# Patient Record
Sex: Female | Born: 1995 | Race: Black or African American | Hispanic: No | Marital: Single | Smoking: Current every day smoker
Health system: Southern US, Community
[De-identification: ages and names within clinical notes are randomized; demographics above are authoritative.]

---
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
## 2014-08-18 ENCOUNTER — Emergency Department: Payer: Self-pay | Admitting: Emergency Medicine

## 2015-02-25 NOTE — H&P (Signed)
L&D Evaluation:  History:   HPI 19yo G1 at 7726w5d by 13wk U/S presents with c/o persistent LOF since 1100 today.  No contractions or pain. PNC at [facility].  PNL - O+, RPR NR, RI, HepB -, HIV -, GC/chlam -, 1h GTT 105, Hgb 11.4, Varicella equivocal, GBS +    Presents with leaking fluid    Patient's Medical History Asthma  well controlled    Patient's Surgical History none    Medications Pre Natal Vitamins  pulmicort, zyrtec, albuterol    Allergies NKDA    Social History none    Family History Non-Contributory   ROS:   ROS All systems were reviewed.  HEENT, CNS, GI, GU, Respiratory, CV, Renal and Musculoskeletal systems were found to be normal.   Exam:   Vital Signs stable    General no apparent distress    Mental Status clear    Chest nl effort    Abdomen gravid, non-tender    Estimated Fetal Weight Average for gestational age, 7.5#    Edema no edema    Pelvic 4/ 100 / -1, + nitrazine, gross ROM    Mebranes Ruptured    FHT normal rate with no decels    Ucx absent    Skin dry   Impression:   Impression 19yo G1 at 3926w5d with PROM   Plan:   Plan monitor contractions and for cervical change, antibiotics for GBBS prophylaxis, start pitocin for augmentation of labor   Electronic Signatures: Garnette GunnerStansbury Clipp, Ali LoweEryn K (MD)  (Signed 15-Sep-13 16:48)  Authored: L&D Evaluation   Last Updated: 15-Sep-13 16:48 by Garnette GunnerStansbury Clipp, Ali LoweEryn K (MD)

## 2017-09-18 ENCOUNTER — Encounter: Payer: Self-pay | Admitting: Emergency Medicine

## 2017-09-18 ENCOUNTER — Emergency Department: Payer: Managed Care, Other (non HMO)

## 2017-09-18 ENCOUNTER — Emergency Department
Admission: EM | Admit: 2017-09-18 | Discharge: 2017-09-18 | Disposition: A | Discharge status: Home / Self Care | Payer: Managed Care, Other (non HMO) | Attending: Emergency Medicine | Admitting: Emergency Medicine

## 2017-09-18 DIAGNOSIS — N898 Other specified noninflammatory disorders of vagina: Secondary | ICD-10-CM | POA: Diagnosis not present

## 2017-09-18 DIAGNOSIS — N739 Female pelvic inflammatory disease, unspecified: Secondary | ICD-10-CM | POA: Diagnosis not present

## 2017-09-18 DIAGNOSIS — F17200 Nicotine dependence, unspecified, uncomplicated: Secondary | ICD-10-CM | POA: Insufficient documentation

## 2017-09-18 DIAGNOSIS — R1030 Lower abdominal pain, unspecified: Secondary | ICD-10-CM | POA: Diagnosis present

## 2017-09-18 DIAGNOSIS — N730 Acute parametritis and pelvic cellulitis: Secondary | ICD-10-CM

## 2017-09-18 LAB — URINALYSIS, COMPLETE (UACMP) WITH MICROSCOPIC
Bacteria, UA: NONE SEEN
Bilirubin Urine: NEGATIVE
Glucose, UA: NEGATIVE mg/dL
HGB URINE DIPSTICK: NEGATIVE
Ketones, ur: NEGATIVE mg/dL
NITRITE: NEGATIVE
Protein, ur: NEGATIVE mg/dL
SPECIFIC GRAVITY, URINE: 1.016 (ref 1.005–1.030)
pH: 8 (ref 5.0–8.0)

## 2017-09-18 LAB — CBC
HCT: 39.3 % (ref 35.0–47.0)
HEMOGLOBIN: 13.1 g/dL (ref 12.0–16.0)
MCH: 26.4 pg (ref 26.0–34.0)
MCHC: 33.2 g/dL (ref 32.0–36.0)
MCV: 79.5 fL — ABNORMAL LOW (ref 80.0–100.0)
Platelets: 235 10*3/uL (ref 150–440)
RBC: 4.95 MIL/uL (ref 3.80–5.20)
RDW: 13.9 % (ref 11.5–14.5)
WBC: 9 10*3/uL (ref 3.6–11.0)

## 2017-09-18 LAB — LIPASE, BLOOD: Lipase: 23 U/L (ref 11–51)

## 2017-09-18 LAB — COMPREHENSIVE METABOLIC PANEL
ALK PHOS: 80 U/L (ref 38–126)
ALT: 15 U/L (ref 14–54)
ANION GAP: 10 (ref 5–15)
AST: 24 U/L (ref 15–41)
Albumin: 4.4 g/dL (ref 3.5–5.0)
BILIRUBIN TOTAL: 0.2 mg/dL — AB (ref 0.3–1.2)
BUN: 9 mg/dL (ref 6–20)
CALCIUM: 9.6 mg/dL (ref 8.9–10.3)
CO2: 24 mmol/L (ref 22–32)
CREATININE: 0.84 mg/dL (ref 0.44–1.00)
Chloride: 104 mmol/L (ref 101–111)
GFR calc Af Amer: >60 mL/min (ref >60)
GLUCOSE: 92 mg/dL (ref 65–99)
Potassium: 3.7 mmol/L (ref 3.5–5.1)
SODIUM: 138 mmol/L (ref 135–145)
TOTAL PROTEIN: 7.7 g/dL (ref 6.5–8.1)

## 2017-09-18 LAB — CHLAMYDIA/NGC RT PCR (ARMC ONLY)
Chlamydia Tr: NOT DETECTED
N GONORRHOEAE: NOT DETECTED

## 2017-09-18 LAB — HCG, QUANTITATIVE, PREGNANCY: hCG, Beta Chain, Quant, S: <1 m[IU]/mL (ref <5)

## 2017-09-18 LAB — POCT PREGNANCY, URINE: Preg Test, Ur: NEGATIVE

## 2017-09-18 LAB — WET PREP, GENITAL
Clue Cells Wet Prep HPF POC: NONE SEEN
SPERM: NONE SEEN
TRICH WET PREP: NONE SEEN
YEAST WET PREP: NONE SEEN

## 2017-09-18 MED ORDER — CEFTRIAXONE SODIUM 250 MG IJ SOLR
INTRAMUSCULAR | Status: AC
  Filled 2017-09-18: qty 250

## 2017-09-18 MED ORDER — AZITHROMYCIN 500 MG PO TABS
1000.0000 mg | ORAL_TABLET | Freq: Once | ORAL | Status: AC
  Administered 2017-09-18: 1000 mg via ORAL

## 2017-09-18 MED ORDER — IOPAMIDOL (ISOVUE-300) INJECTION 61%
100.0000 mL | Freq: Once | INTRAVENOUS | Status: AC | PRN
  Administered 2017-09-18: 100 mL via INTRAVENOUS
  Filled 2017-09-18: qty 100

## 2017-09-18 MED ORDER — ONDANSETRON 4 MG PO TBDP: 4.0000 mg | ORAL_TABLET | Freq: Three times a day (TID) | ORAL | 0 refills | Status: DC | PRN

## 2017-09-18 MED ORDER — HYDROCODONE-ACETAMINOPHEN 5-325 MG PO TABS: 1.0000 | ORAL_TABLET | Freq: Four times a day (QID) | ORAL | 0 refills | Status: DC | PRN

## 2017-09-18 MED ORDER — CEFTRIAXONE SODIUM 250 MG IJ SOLR
250.0000 mg | Freq: Once | INTRAMUSCULAR | Status: AC
  Administered 2017-09-18: 250 mg via INTRAMUSCULAR

## 2017-09-18 MED ORDER — AZITHROMYCIN 500 MG PO TABS
ORAL_TABLET | ORAL | Status: AC
  Filled 2017-09-18: qty 2

## 2017-09-18 NOTE — ED Triage Notes (Signed)
Patient with complaint of lower abdominal pain with white vaginal discharge and pain with urination that started yesterday.

## 2017-09-18 NOTE — Discharge Instructions (Signed)
Please take your pain and nausea medication as needed for severe symptoms and make sure you follow-up with your doctor in 2 days for recheck.  If you are unable to get in to see your primary care physician please come to either urgent care or back to our emergency department.  Return to the emergency department sooner for any new or worsening symptoms such as worsening pain, fevers, chills, if you cannot eat or drink, or for any other issues whatsoever.  It was a pleasure to take care of you today, and thank you for coming to our emergency department.  If you have any questions or concerns before leaving please ask the nurse to grab me and I'm more than happy to go through your aftercare instructions again.  If you were prescribed any opioid pain medication today such as Norco, Vicodin, Percocet, morphine, hydrocodone, or oxycodone please make sure you do not drive when you are taking this medication as it can alter your ability to drive safely.  If you have any concerns once you are home that you are not improving or are in fact getting worse before you can make it to your follow-up appointment, please do not hesitate to call 911 and come back for further evaluation.  Merrily BrittleNeil Danely Bayliss, MD  Results for orders placed or performed during the hospital encounter of 09/18/17  Wet prep, genital  Result Value Ref Range   Yeast Wet Prep HPF POC NONE SEEN NONE SEEN   Trich, Wet Prep NONE SEEN NONE SEEN   Clue Cells Wet Prep HPF POC NONE SEEN NONE SEEN   WBC, Wet Prep HPF POC FEW (A) NONE SEEN   Sperm NONE SEEN   Lipase, blood  Result Value Ref Range   Lipase 23 11 - 51 U/L  Comprehensive metabolic panel  Result Value Ref Range   Sodium 138 135 - 145 mmol/L   Potassium 3.7 3.5 - 5.1 mmol/L   Chloride 104 101 - 111 mmol/L   CO2 24 22 - 32 mmol/L   Glucose, Bld 92 65 - 99 mg/dL   BUN 9 6 - 20 mg/dL   Creatinine, Ser 0.980.84 0.44 - 1.00 mg/dL   Calcium 9.6 8.9 - 11.910.3 mg/dL   Total Protein 7.7 6.5 -  8.1 g/dL   Albumin 4.4 3.5 - 5.0 g/dL   AST 24 15 - 41 U/L   ALT 15 14 - 54 U/L   Alkaline Phosphatase 80 38 - 126 U/L   Total Bilirubin 0.2 (L) 0.3 - 1.2 mg/dL   GFR calc non Af Amer >60 >60 mL/min   GFR calc Af Amer >60 >60 mL/min   Anion gap 10 5 - 15  CBC  Result Value Ref Range   WBC 9.0 3.6 - 11.0 K/uL   RBC 4.95 3.80 - 5.20 MIL/uL   Hemoglobin 13.1 12.0 - 16.0 g/dL   HCT 14.739.3 82.935.0 - 56.247.0 %   MCV 79.5 (L) 80.0 - 100.0 fL   MCH 26.4 26.0 - 34.0 pg   MCHC 33.2 32.0 - 36.0 g/dL   RDW 13.013.9 86.511.5 - 78.414.5 %   Platelets 235 150 - 440 K/uL  Urinalysis, Complete w Microscopic  Result Value Ref Range   Color, Urine YELLOW (A) YELLOW   APPearance HAZY (A) CLEAR   Specific Gravity, Urine 1.016 1.005 - 1.030   pH 8.0 5.0 - 8.0   Glucose, UA NEGATIVE NEGATIVE mg/dL   Hgb urine dipstick NEGATIVE NEGATIVE   Bilirubin Urine NEGATIVE NEGATIVE  Ketones, ur NEGATIVE NEGATIVE mg/dL   Protein, ur NEGATIVE NEGATIVE mg/dL   Nitrite NEGATIVE NEGATIVE   Leukocytes, UA TRACE (A) NEGATIVE   RBC / HPF 0-5 0 - 5 RBC/hpf   WBC, UA 0-5 0 - 5 WBC/hpf   Bacteria, UA NONE SEEN NONE SEEN   Squamous Epithelial / LPF 6-30 (A) NONE SEEN  hCG, quantitative, pregnancy  Result Value Ref Range   hCG, Beta Chain, Quant, S <1 <5 mIU/mL  Pregnancy, urine POC  Result Value Ref Range   Preg Test, Ur NEGATIVE NEGATIVE   Ct Abdomen Pelvis W Contrast  Result Date: 09/18/2017 CLINICAL DATA:  Lower abdominal pain, vaginal discharge and pain with urination. EXAM: CT ABDOMEN AND PELVIS WITH CONTRAST TECHNIQUE: Multidetector CT imaging of the abdomen and pelvis was performed using the standard protocol following bolus administration of intravenous contrast. CONTRAST:  100mL ISOVUE-300 IOPAMIDOL (ISOVUE-300) INJECTION 61% COMPARISON:  None. FINDINGS: Lower chest: No acute abnormality. Hepatobiliary: No focal liver abnormality is seen. No gallstones, gallbladder wall thickening, or biliary dilatation. Pancreas:  Unremarkable. No pancreatic ductal dilatation or surrounding inflammatory changes. Spleen: Normal in size without focal abnormality. Adrenals/Urinary Tract: Adrenal glands are unremarkable. Kidneys are normal, without renal calculi, focal lesion, or hydronephrosis. Diffuse mucosal thickening of the urinary bladder wall. Stomach/Bowel: Stomach is within normal limits. Appendix appears normal. No evidence of bowel wall thickening, distention, or inflammatory changes. Vascular/Lymphatic: No significant vascular findings are present. No enlarged abdominal or pelvic lymph nodes. Reproductive: Normal appearance of the uterus. Small amount of fluid in the bilateral adnexal regions. 4.5 cm complicated cyst on the right ovary. Suggestion of fluid density curvilinear structure in the right adnexa. Other: Small amount of free pelvic fluid. Musculoskeletal: No acute or significant osseous findings. IMPRESSION: No evidence of appendicitis. 4.5 cm complex cystic structure on the right ovary. Suggestion of fluid density curvilinear structure in the right adnexa. Small amount of fluid in the adnexal regions and in the cul-de-sac of the pelvis. These findings are not conclusive but are suspicious for pelvic inflammatory disease with involvement of the right fallopian tube. Clinical correlation is recommended. If further imaging is desired, pelvic ultrasound may be considered. Diffuse thickening of the urinary bladder wall, likely infectious/ inflammatory. Electronically Signed   By: Ted Mcalpineobrinka  Dimitrova M.D.   On: 09/18/2017 22:12

## 2017-09-18 NOTE — ED Provider Notes (Signed)
[facility] Emergency Department Provider Note  ____________________________________________   First MD Initiated Contact with Patient 09/18/17 2027     (approximate)  I have reviewed the triage vital signs and the nursing notes.   HISTORY  Chief Complaint Abdominal Pain   HPI Sonya Sparks is a 21 y.o. female comes to the emergency department with roughly 24 hours of moderate severity cramping lower abdominal pain.  Also associated with increased amount of white vaginal discharge.  She has had some dysuria and frequency but no back pain or fevers or chills.  She has no history of abdominal surgeries.  She has had no diarrhea.  Her pain is been constant nothing seems to make it better or worse.  It is nonradiating.  History reviewed. No pertinent past medical history.  There are no active problems to display for this patient.   History reviewed. No pertinent surgical history.  Prior to Admission medications   Medication Sig Start Date End Date Taking? Authorizing Provider  HYDROcodone-acetaminophen (NORCO) 5-325 MG tablet Take 1 tablet by mouth every 6 (six) hours as needed for up to 7 doses for severe pain. 09/18/17   Merrily Brittleifenbark, Russel Morain, MD  ondansetron (ZOFRAN ODT) 4 MG disintegrating tablet Take 1 tablet (4 mg total) by mouth every 8 (eight) hours as needed for nausea or vomiting. 09/18/17   Merrily Brittleifenbark, Oktober Glazer, MD    Allergies Patient has no known allergies.  No family history on file.  Social History Social History   Tobacco Use  . Smoking status: Current Every Day Smoker  . Smokeless tobacco: Never Used  Substance Use Topics  . Alcohol use: Not on file  . Drug use: Not on file    Review of Systems Constitutional: No fever/chills Eyes: No visual changes. ENT: No sore throat. Cardiovascular: Denies chest pain. Respiratory: Denies shortness of breath. Gastrointestinal: Positive for abdominal pain.  No nausea, no vomiting.  No diarrhea.  No  constipation. Genitourinary: Positive for dysuria. Musculoskeletal: Negative for back pain. Skin: Negative for rash. Neurological: Negative for headaches, focal weakness or numbness.   ____________________________________________   PHYSICAL EXAM:  VITAL SIGNS: ED Triage Vitals  Enc Vitals Group     BP 09/18/17 2016 (!) 116/56     Pulse Rate 09/18/17 2016 86     Resp 09/18/17 2016 16     Temp 09/18/17 2016 98 F (36.7 C)     Temp Source 09/18/17 2016 Oral     SpO2 09/18/17 2016 100 %     Weight 09/18/17 2017 200 lb (90.7 kg)     Height 09/18/17 2017 5\' 5"  (1.651 m)     Head Circumference --      Peak Flow --      Pain Score 09/18/17 2019 8     Pain Loc --      Pain Edu? --      Excl. in GC? --     Constitutional: Alert and oriented x4 well-appearing nontoxic no diaphoresis speaks in full clear sentences Eyes: PERRL EOMI. Head: Atraumatic. Nose: No congestion/rhinnorhea. Mouth/Throat: No trismus Neck: No stridor.   Cardiovascular: Normal rate, regular rhythm. Grossly normal heart sounds.  Good peripheral circulation. Respiratory: Normal respiratory effort.  No retractions. Lungs CTAB and moving good air Gastrointestinal: Soft mild left greater than right lower quadrant tenderness with negative Rovsing's no McBurney's tenderness and no frank peritonitis Pelvic exam chaperoned by female nurse Penni BombardKendall: Normal external exam.  Moderate amount of thick white discharge in the vault.  No cervical motion tenderness no adnexal tenderness Musculoskeletal: No lower extremity edema   Neurologic:  Normal speech and language. No gross focal neurologic deficits are appreciated. Skin:  Skin is warm, dry and intact. No rash noted. Psychiatric: Mood and affect are normal. Speech and behavior are normal.    ____________________________________________   DIFFERENTIAL includes but not limited to  Appendicitis, urinary tract infection, diverticulitis, pelvic inflammatory disease,  tubo-ovarian abscess ____________________________________________   LABS (all labs ordered are listed, but only abnormal results are displayed)  Labs Reviewed  WET PREP, GENITAL - Abnormal; Notable for the following components:      Result Value   WBC, Wet Prep HPF POC FEW (*)    All other components within normal limits  COMPREHENSIVE METABOLIC PANEL - Abnormal; Notable for the following components:   Total Bilirubin 0.2 (*)    All other components within normal limits  CBC - Abnormal; Notable for the following components:   MCV 79.5 (*)    All other components within normal limits  URINALYSIS, COMPLETE (UACMP) WITH MICROSCOPIC - Abnormal; Notable for the following components:   Color, Urine YELLOW (*)    APPearance HAZY (*)    Leukocytes, UA TRACE (*)    Squamous Epithelial / LPF 6-30 (*)    All other components within normal limits  CHLAMYDIA/NGC RT PCR ([facility] ONLY)  LIPASE, BLOOD  HCG, QUANTITATIVE, PREGNANCY  POC URINE PREG, ED  POCT PREGNANCY, URINE    Lab work reviewed by me is nonspecific with no acute disease.  Some white cells on the wet prep could represent STI __________________________________________  EKG   ____________________________________________  RADIOLOGY  CT abdomen pelvis reviewed by me negative for appendicitis but is concerning for PID ____________________________________________   PROCEDURES  Procedure(s) performed: no  Procedures  Critical Care performed: no  Observation: no ____________________________________________   INITIAL IMPRESSION / ASSESSMENT AND PLAN / ED COURSE  Pertinent labs & imaging results that were available during my care of the patient were reviewed by me and considered in my medical decision making (see chart for details).  The patient arrives uncomfortable appearing with significant right lower quadrant tenderness.  Pain medication and CT scan are  pending.  ----------------------------------------- 10:20 PM on 09/18/2017 -----------------------------------------  Fortunately the patient's pain is improved.  While her GC CT is pending the CT scan is concerning for pelvic inflammatory disease so I recommended presumptive treatment now and the patient agrees.  No evidence of tubo-ovarian abscess.  We will discharge her home with Norco and Zofran and a 48-hour abdominal recheck.  She verbalized understanding and agreement with the plan.     ____________________________________________   FINAL CLINICAL IMPRESSION(S) / ED DIAGNOSES  Final diagnoses:  Pelvic inflammatory disease, acute      NEW MEDICATIONS STARTED DURING THIS VISIT:  This SmartLink is deprecated. Use AVSMEDLIST instead to display the medication list for a patient.   Note:  This document was prepared using Dragon voice recognition software and may include unintentional dictation errors.     Merrily Brittleifenbark, Kaliyan Osbourn, MD 09/18/17 2221

## 2018-08-08 IMAGING — CT CT ABD-PELV W/ CM
2 of 4 series · 16 of 46 positions shown, 18 images · IV contrast (APPLIED)
Comparison: None.

CLINICAL DATA: Lower abdominal pain, vaginal discharge and pain
with urination.

EXAM:
CT ABDOMEN AND PELVIS WITH CONTRAST
TECHNIQUE: Multidetector CT imaging of the abdomen and pelvis was performed
using the standard protocol following bolus administration of
intravenous contrast.
CONTRAST:  100mL UM0AAT-OHH IOPAMIDOL (UM0AAT-OHH) INJECTION 61%

[Series 2: routine abd/pel with · axial · 0.68mm/px · z∈[-455,-65]mm · 13 of 86 slices shown, 15 images]
[im 4/86  soft-tissue]
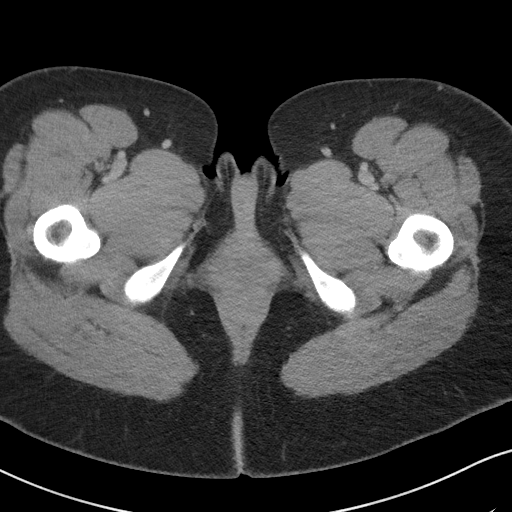
[im 4/86  bone]
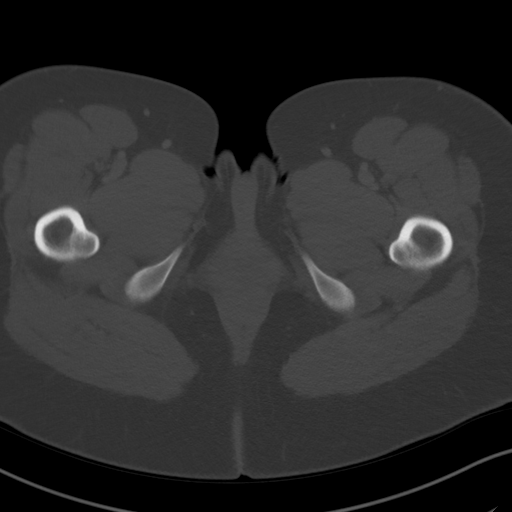
[im 11/86  soft-tissue]
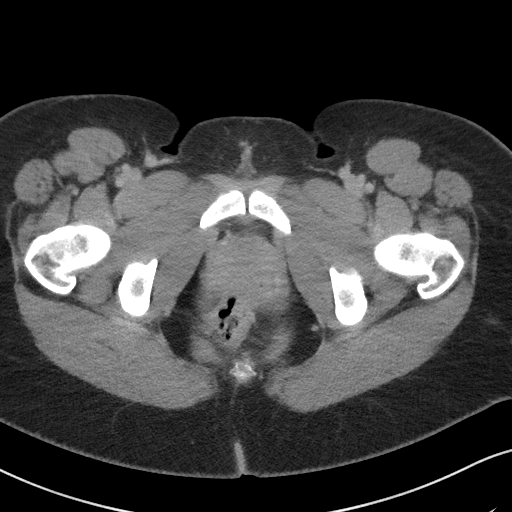
[im 18/86  soft-tissue]
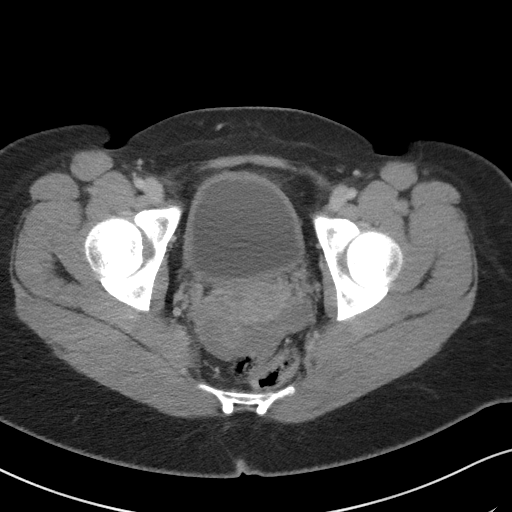
[im 25/86  soft-tissue]
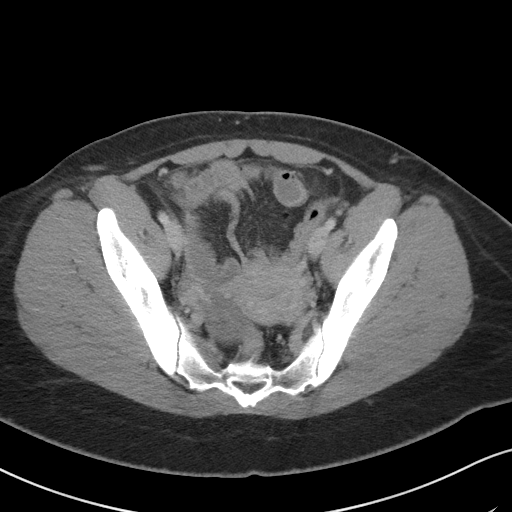
[im 29/86  soft-tissue]
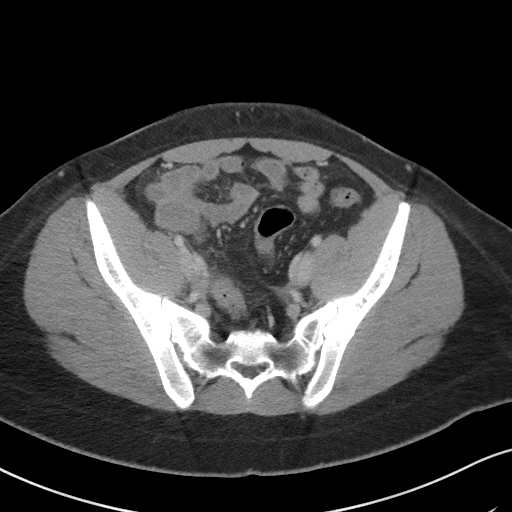
[im 36/86  soft-tissue]
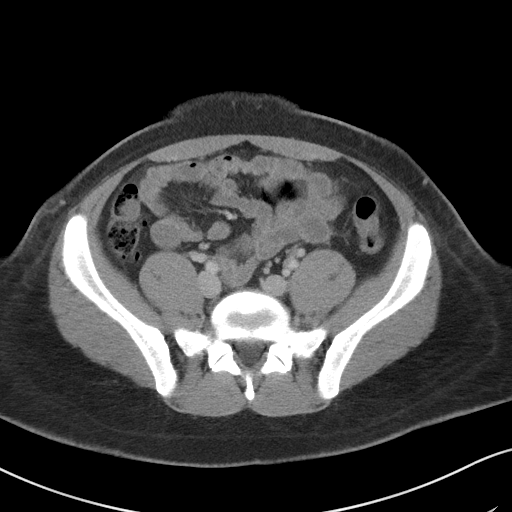
[im 43/86  soft-tissue]
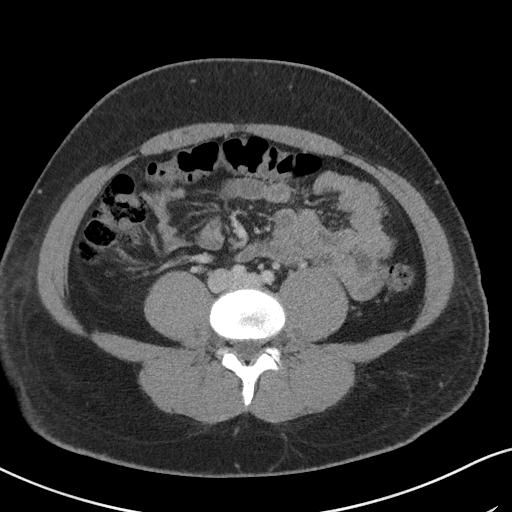
[im 50/86  soft-tissue]
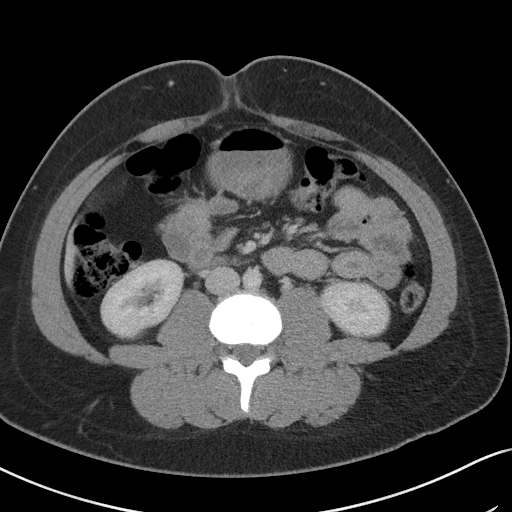
[im 57/86  soft-tissue]
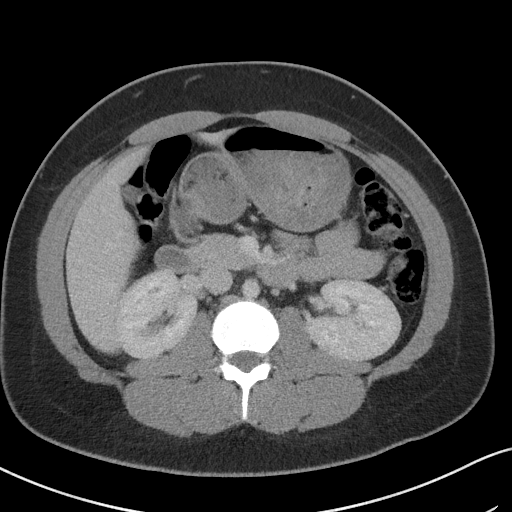
[im 57/86  bone]
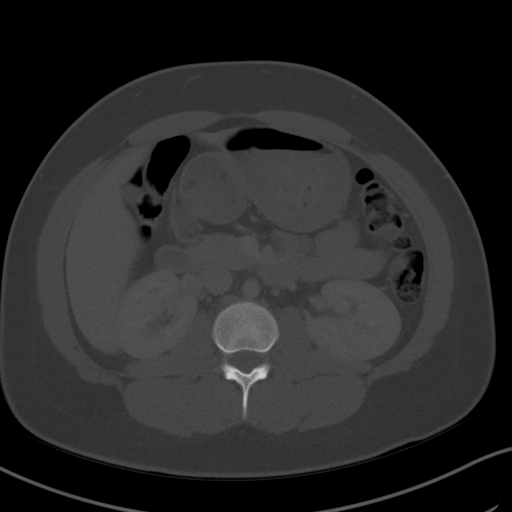
[im 61/86  soft-tissue]
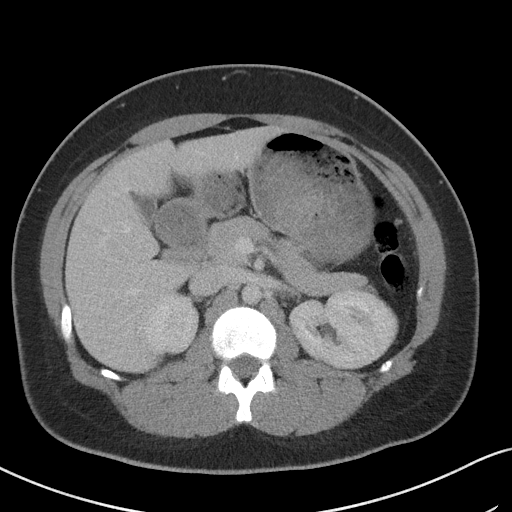
[im 68/86  soft-tissue]
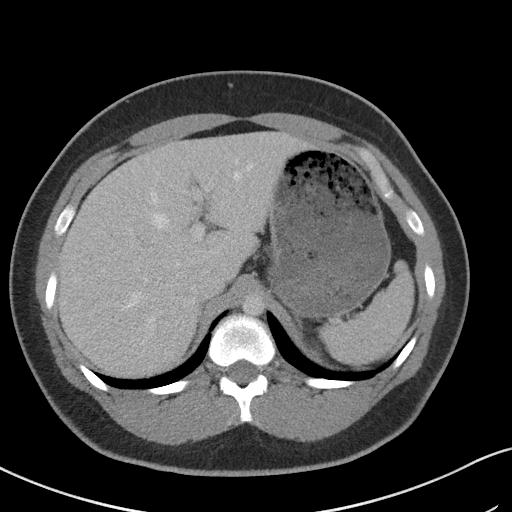
[im 75/86  soft-tissue]
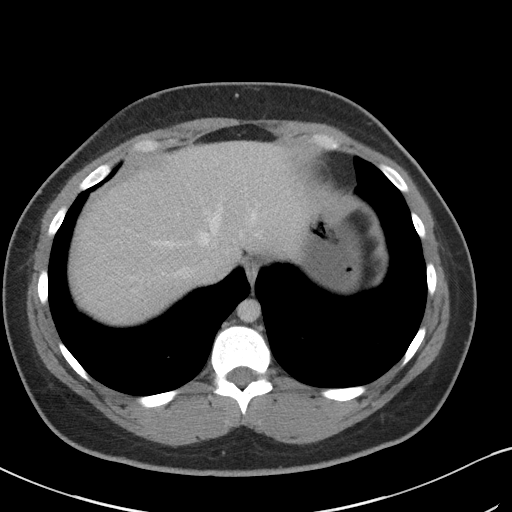
[im 82/86  soft-tissue]
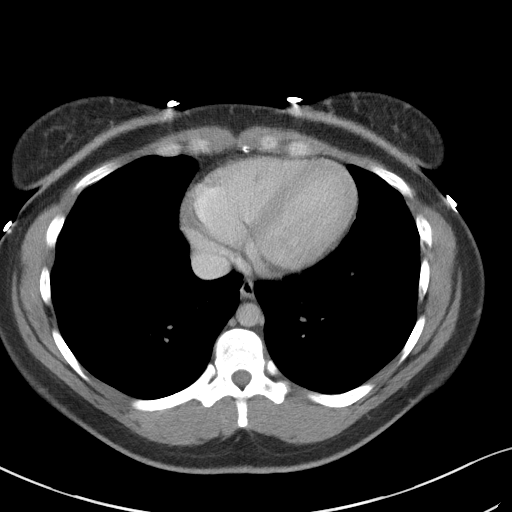

[Series 5: coronal st · coronal · 0.70mm/px · 3 of 92 slices shown]
[im 31/92  soft-tissue]
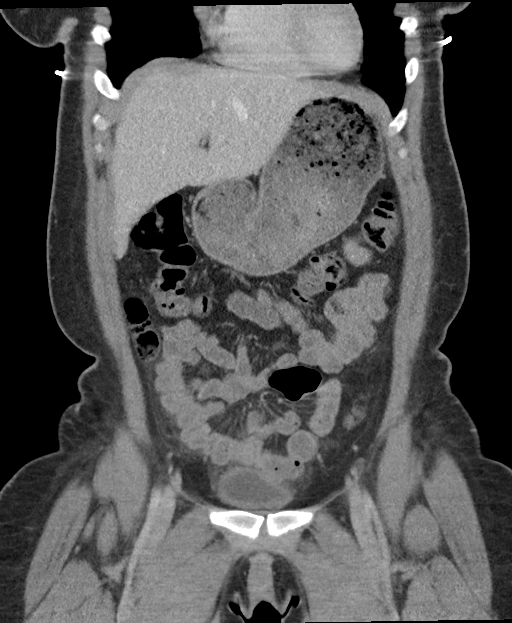
[im 41/92  soft-tissue]
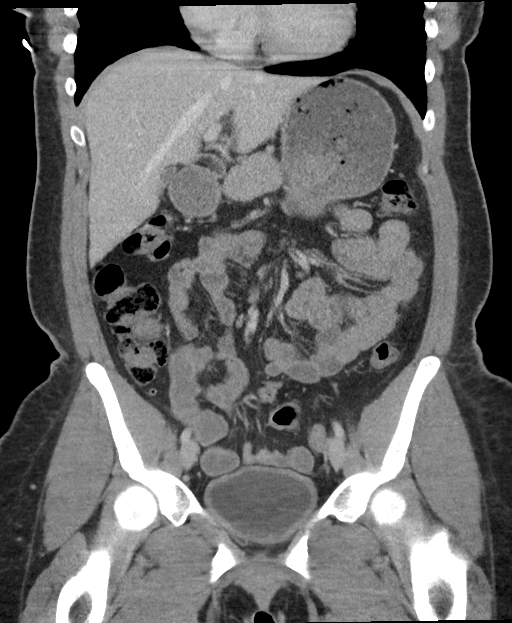
[im 51/92  soft-tissue]
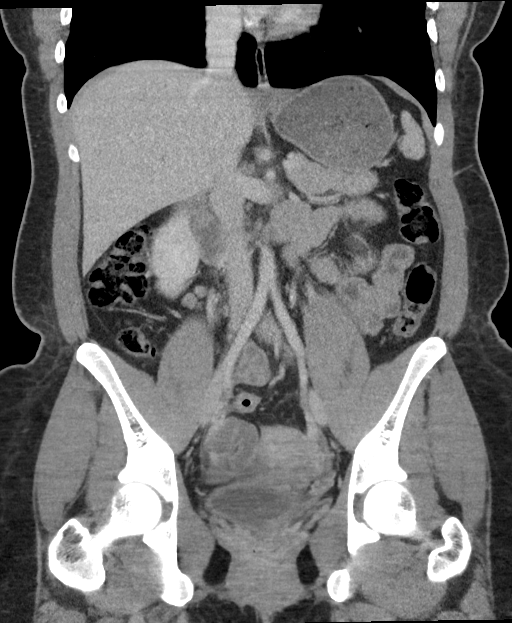

[16 of 46 positions shown; findings below may reference images not displayed]

FINDINGS: Lower chest: No acute abnormality.

Hepatobiliary: No focal liver abnormality is seen. No gallstones,
gallbladder wall thickening, or biliary dilatation.

Pancreas: Unremarkable. No pancreatic ductal dilatation or
surrounding inflammatory changes.

Spleen: Normal in size without focal abnormality.

Adrenals/Urinary Tract: Adrenal glands are unremarkable. Kidneys are
normal, without renal calculi, focal lesion, or hydronephrosis.
Diffuse mucosal thickening of the urinary bladder wall.

Stomach/Bowel: Stomach is within normal limits. Appendix appears
normal. No evidence of bowel wall thickening, distention, or
inflammatory changes.

Vascular/Lymphatic: No significant vascular findings are present. No
enlarged abdominal or pelvic lymph nodes.

Reproductive: Normal appearance of the uterus. Small amount of fluid
in the bilateral adnexal regions. 4.5 cm complicated cyst on the
right ovary. Suggestion of fluid density curvilinear structure in
the right adnexa.

Other: Small amount of free pelvic fluid.

Musculoskeletal: No acute or significant osseous findings.
IMPRESSION: No evidence of appendicitis.

4.5 cm complex cystic structure on the right ovary. Suggestion of
fluid density curvilinear structure in the right adnexa. Small
amount of fluid in the adnexal regions and in the cul-de-sac of the
pelvis. These findings are not conclusive but are suspicious for
pelvic inflammatory disease with involvement of the right fallopian
tube. Clinical correlation is recommended. If further imaging is
desired, pelvic ultrasound may be considered.

Diffuse thickening of the urinary bladder wall, likely infectious/
inflammatory.

## 2019-06-01 ENCOUNTER — Emergency Department
Admission: EM | Admit: 2019-06-01 | Discharge: 2019-06-01 | Disposition: A | Discharge status: Home / Self Care | Payer: Medicaid Other | Attending: Emergency Medicine | Admitting: Emergency Medicine

## 2019-06-01 ENCOUNTER — Emergency Department: Payer: Medicaid Other

## 2019-06-01 ENCOUNTER — Other Ambulatory Visit: Payer: Self-pay

## 2019-06-01 DIAGNOSIS — Z79899 Other long term (current) drug therapy: Secondary | ICD-10-CM | POA: Insufficient documentation

## 2019-06-01 DIAGNOSIS — F17200 Nicotine dependence, unspecified, uncomplicated: Secondary | ICD-10-CM | POA: Diagnosis not present

## 2019-06-01 DIAGNOSIS — M79604 Pain in right leg: Secondary | ICD-10-CM

## 2019-06-01 LAB — BASIC METABOLIC PANEL
Anion gap: 8 (ref 5–15)
BUN: 9 mg/dL (ref 6–20)
CO2: 23 mmol/L (ref 22–32)
Calcium: 9.2 mg/dL (ref 8.9–10.3)
Chloride: 108 mmol/L (ref 98–111)
Creatinine, Ser: 0.83 mg/dL (ref 0.44–1.00)
GFR calc Af Amer: >60 mL/min (ref >60)
GFR calc non Af Amer: >60 mL/min (ref >60)
Glucose, Bld: 80 mg/dL (ref 70–99)
Potassium: 4.2 mmol/L (ref 3.5–5.1)
Sodium: 139 mmol/L (ref 135–145)

## 2019-06-01 LAB — CBC WITH DIFFERENTIAL/PLATELET
Abs Immature Granulocytes: 0.01 10*3/uL (ref 0.00–0.07)
Basophils Absolute: 0 10*3/uL (ref 0.0–0.1)
Basophils Relative: 1 %
Eosinophils Absolute: 0.2 10*3/uL (ref 0.0–0.5)
Eosinophils Relative: 3 %
HCT: 40.2 % (ref 36.0–46.0)
Hemoglobin: 12.5 g/dL (ref 12.0–15.0)
Immature Granulocytes: 0 %
Lymphocytes Relative: 39 %
Lymphs Abs: 2.6 10*3/uL (ref 0.7–4.0)
MCH: 25.2 pg — ABNORMAL LOW (ref 26.0–34.0)
MCHC: 31.1 g/dL (ref 30.0–36.0)
MCV: 81 fL (ref 80.0–100.0)
Monocytes Absolute: 0.4 10*3/uL (ref 0.1–1.0)
Monocytes Relative: 6 %
Neutro Abs: 3.4 10*3/uL (ref 1.7–7.7)
Neutrophils Relative %: 51 %
Platelets: 240 10*3/uL (ref 150–400)
RBC: 4.96 MIL/uL (ref 3.87–5.11)
RDW: 13.7 % (ref 11.5–15.5)
WBC: 6.6 10*3/uL (ref 4.0–10.5)
nRBC: 0 % (ref 0.0–0.2)

## 2019-06-01 MED ORDER — NAPROXEN 500 MG PO TABS: 500.0000 mg | ORAL_TABLET | Freq: Two times a day (BID) | ORAL | 1 refills | Status: DC

## 2019-06-01 NOTE — Discharge Instructions (Addendum)
Follow-up with your doctor at [facility] if any continued problems.  Begin taking naproxen 500 mg twice daily with food.  You may use ice or heat to your leg as needed for discomfort.  You may take Tylenol with this medication if additional pain medication is needed.

## 2019-06-01 NOTE — ED Provider Notes (Signed)
[facility] Emergency Department Provider Note   ____________________________________________   First MD Initiated Contact with Patient 06/01/19 1007     (approximate)  I have reviewed the triage vital signs and the nursing notes.   HISTORY  Chief Complaint Fatigue and Leg Pain   HPI Sonya Sparks is a 23 y.o. female presents to the ED with complaint of right upper leg pain for over 1 month.  Patient denies any injury and does not take any over-the-counter medication.  Patient states that she had a virtual visit with her PCP today and was told to come to the emergency department for a ultrasound to rule out DVT.  Patient currently has a Nexplanon for the last 7 months without any difficulties.  She also continues to smoke less than 1/2 pack cigarettes per day.  She denies any recent traveling, injury, shortness of breath or previous history of DVT.  She continues to ambulate without any assistance.  Currently she rates her pain as a 0/10.      History reviewed. No pertinent past medical history.  There are no active problems to display for this patient.   History reviewed. No pertinent surgical history.  Prior to Admission medications   Medication Sig Start Date End Date Taking? Authorizing Provider  albuterol (VENTOLIN HFA) 108 (90 Base) MCG/ACT inhaler Inhale 1-2 puffs into the lungs every 6 (six) hours as needed for wheezing or shortness of breath.   Yes [provider]  etonogestrel (NEXPLANON) 68 MG IMPL implant 1 each by Subdermal route once.   Yes [provider]  naproxen (NAPROSYN) 500 MG tablet Take 1 tablet (500 mg total) by mouth 2 (two) times daily with a meal. 06/01/19   Tommi RumpsSummers, Eufemia Prindle L, PA-C    Allergies Patient has no known allergies.  No family history on file.  Social History Social History   Tobacco Use   Smoking status: Current Every Day Smoker   Smokeless tobacco: Never Used  Substance Use Topics    Alcohol use: Not on file   Drug use: Not on file    Review of Systems Constitutional: No fever/chills Cardiovascular: Denies chest pain. Respiratory: Denies shortness of breath. Genitourinary: Negative for dysuria. Musculoskeletal: Positive for right leg pain. Skin: Negative for rash. Neurological: Negative for headaches, focal weakness or numbness. ___________________________________________   PHYSICAL EXAM:  VITAL SIGNS: ED Triage Vitals [06/01/19 0954]  Enc Vitals Group     BP 131/62     Pulse Rate 78     Resp 18     Temp 99.8 F (37.7 C)     Temp Source Oral     SpO2 100 %     Weight 217 lb (98.4 kg)     Height 5\' 5"  (1.651 m)     Head Circumference      Peak Flow      Pain Score 0     Pain Loc      Pain Edu?      Excl. in GC?    Constitutional: Alert and oriented. Well appearing and in no acute distress.  Obese. Eyes: Conjunctivae are normal.  Head: Atraumatic. Neck: No stridor.   Cardiovascular: Normal rate, regular rhythm. Grossly normal heart sounds.  Good peripheral circulation. Respiratory: Normal respiratory effort.  No retractions. Lungs CTAB. Gastrointestinal: Soft and nontender. No distention. Musculoskeletal: On examination of the right lower extremity there is tenderness to palpation on the anterior aspect only.  There is no erythema, warmth, discoloration or  soft tissue swelling.  There is no point tenderness on palpation posteriorly and range of motion is without restriction.  Skin is intact. Neurologic:  Normal speech and language. No gross focal neurologic deficits are appreciated. No gait instability. Skin:  Skin is warm, dry and intact.  Psychiatric: Mood and affect are normal. Speech and behavior are normal.  ____________________________________________   LABS (all labs ordered are listed, but only abnormal results are displayed)  Labs Reviewed  CBC WITH DIFFERENTIAL/PLATELET - Abnormal; Notable for the following components:      Result  Value   MCH 25.2 (*)    All other components within normal limits  BASIC METABOLIC PANEL    RADIOLOGY  Official radiology report(s): [place] Venous Img Lower Unilateral Right  Result Date: 06/01/2019 CLINICAL DATA:  Thigh pain. EXAM: Right LOWER EXTREMITY VENOUS DOPPLER ULTRASOUND TECHNIQUE: Gray-scale sonography with graded compression, as well as color Doppler and duplex ultrasound were performed to evaluate the lower extremity deep venous systems from the level of the common femoral vein and including the common femoral, femoral, profunda femoral, popliteal and calf veins including the posterior tibial, peroneal and gastrocnemius veins when visible. The superficial great saphenous vein was also interrogated. Spectral Doppler was utilized to evaluate flow at rest and with distal augmentation maneuvers in the common femoral, femoral and popliteal veins. COMPARISON:  None. FINDINGS: Contralateral Common Femoral Vein: Respiratory phasicity is normal and symmetric with the symptomatic side. No evidence of thrombus. Normal compressibility. Common Femoral Vein: No evidence of thrombus. Normal compressibility, respiratory phasicity and response to augmentation. Saphenofemoral Junction: No evidence of thrombus. Normal compressibility and flow on color Doppler imaging. Profunda Femoral Vein: No evidence of thrombus. Normal compressibility and flow on color Doppler imaging. Femoral Vein: No evidence of thrombus. Normal compressibility, respiratory phasicity and response to augmentation. Popliteal Vein: No evidence of thrombus. Normal compressibility, respiratory phasicity and response to augmentation. Calf Veins: No evidence of thrombus. Normal compressibility and flow on color Doppler imaging. Superficial Great Saphenous Vein: No evidence of thrombus. Normal compressibility. Other Findings:  None. IMPRESSION: No evidence of deep venous thrombosis. Electronically Signed   By: Maisie Fushomas  Register   On: 06/01/2019 12:24     ____________________________________________   PROCEDURES  Procedure(s) performed (including Critical Care):  Procedures   ____________________________________________   INITIAL IMPRESSION / ASSESSMENT AND PLAN / ED COURSE  As part of my medical decision making, I reviewed the following data within the electronic MEDICAL RECORD NUMBER Notes from prior ED visits and [place] Controlled Substance Database  23 year old female presents to the ED with complaint of right lower extremity pain for 1-1/2 months.  Risk factors for a DVT is that Nexplanon and the fact that she smokes.  Patient had a ED visit with her PCP today who reportedly sent her to the ED for a ultrasound to rule out DVT.  She denies any long distance traveling or an injury to her leg.  Patient has not taken any over-the-counter medication and was waiting to discuss that with her PCP.  Physical exam was negative for high suspicion of a DVT.  Tenderness was localized to the anterior portion of her right thigh.  Ultrasound was negative for DVT and patient was discharged with a prescription for naproxen 500 mg twice daily with food.  She is to follow-up with her PCP if any continued problems.  ____________________________________________   FINAL CLINICAL IMPRESSION(S) / ED DIAGNOSES  Final diagnoses:  Right leg pain     ED Discharge Orders  Ordered    naproxen (NAPROSYN) 500 MG tablet  2 times daily with meals     06/01/19 1236           Note:  This document was prepared using Dragon voice recognition software and may include unintentional dictation errors.    Johnn Hai, PA-C 06/01/19 1749    Earleen Newport, MD 06/02/19 1459

## 2019-06-01 NOTE — ED Notes (Signed)
See triage note  States she has had some SOB and pain to right upper leg  States sx's started about 1 -1 1/2 months ago  States pain is to anterior upper thigh and pain has gotten worse  Ambulates well   No redness or swelling  States she was told by PCP she needed to be checked for DVT

## 2019-06-01 NOTE — ED Triage Notes (Signed)
Right upper leg and thigh pain intermittently over last month with no injury. Intermittent SOB and generalized fatigue X 1 month. Pt had televisit with her PCP who referred to ER for further workup. Nexplanon in pt arm since January. Pt alert and oriented X4, active, cooperative, pt in NAD. RR even and unlabored, color WNL.  Denies sob currently. Denies pain currently.

## 2019-06-01 NOTE — ED Triage Notes (Signed)
First Nurse Note.  Patient presents to the ED with right leg pain and shortness of breath.  Patient states she called her PCP who was worried about a possible blood clot.  Patient states she has a birthcontrol implant in her arm.  Denies history of blood clots.

## 2020-04-20 IMAGING — US RIGHT LOWER EXTREMITY VENOUS ULTRASOUND
1 series · 13 of 24 positions shown · non-contrast
Comparison: None.

CLINICAL DATA: Thigh pain.



[Series 1: right lower extremity venous ultrasound · 13 of 34 slices shown]
[im 1/34]
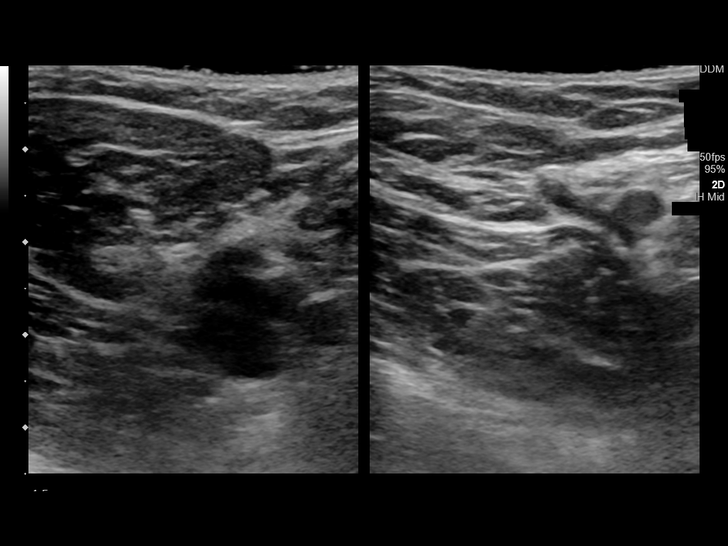
[im 3/34]
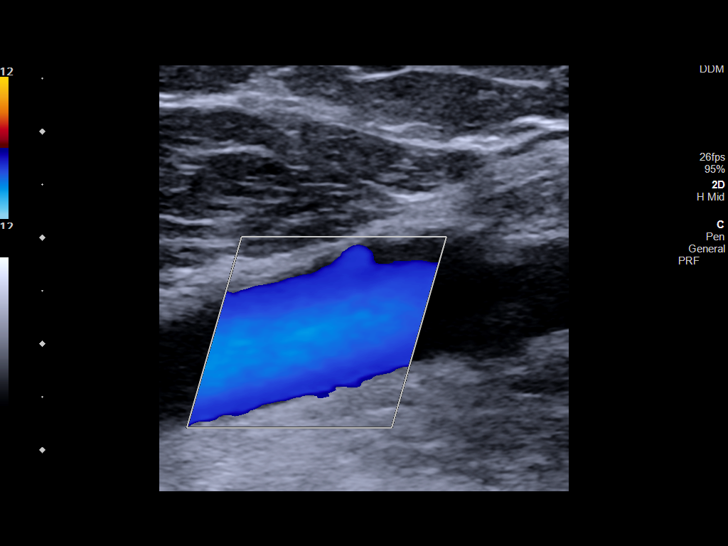
[im 6/34]
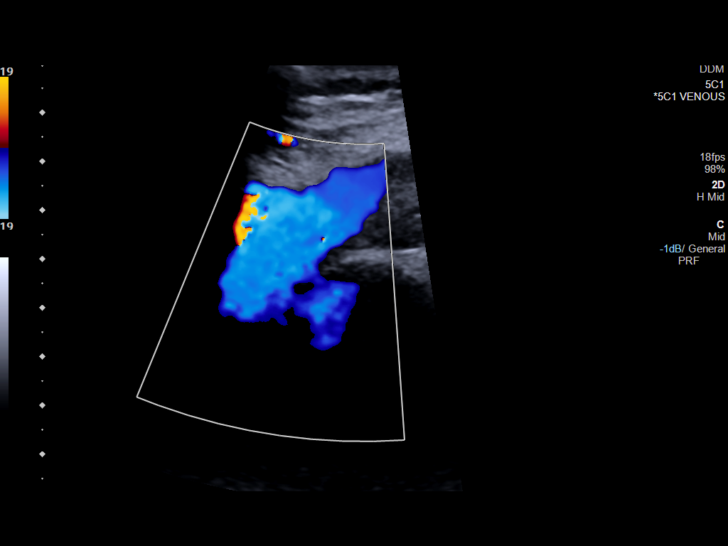
[im 9/34]
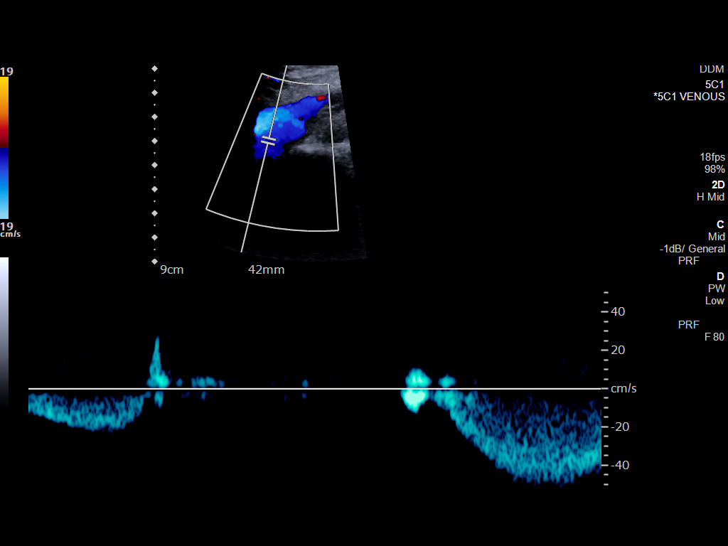
[im 12/34]
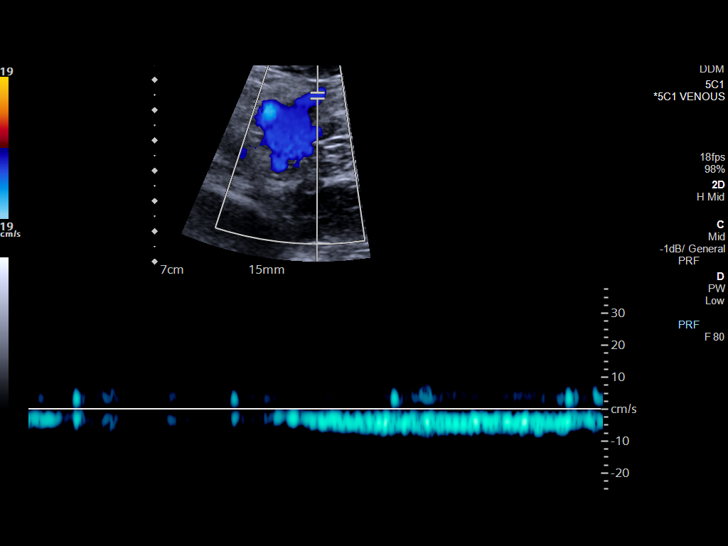
[im 15/34]
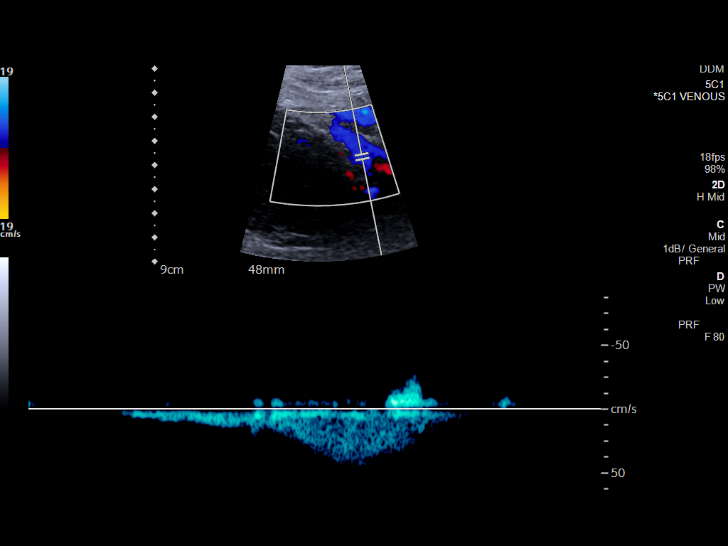
[im 18/34]
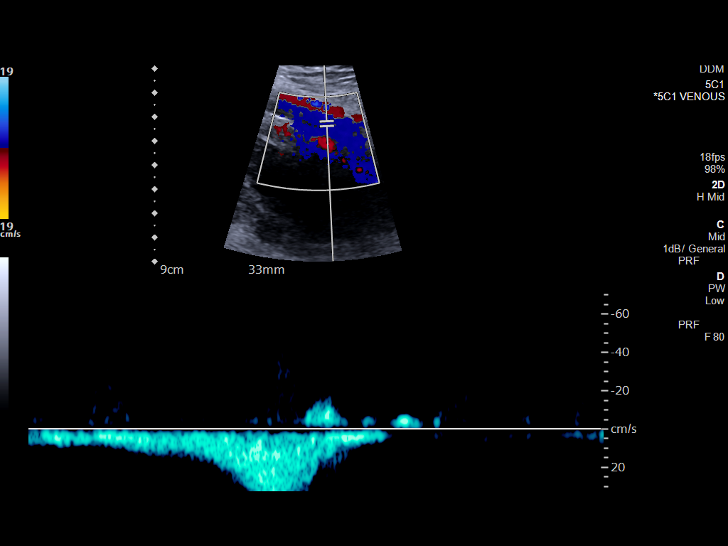
[im 19/34]
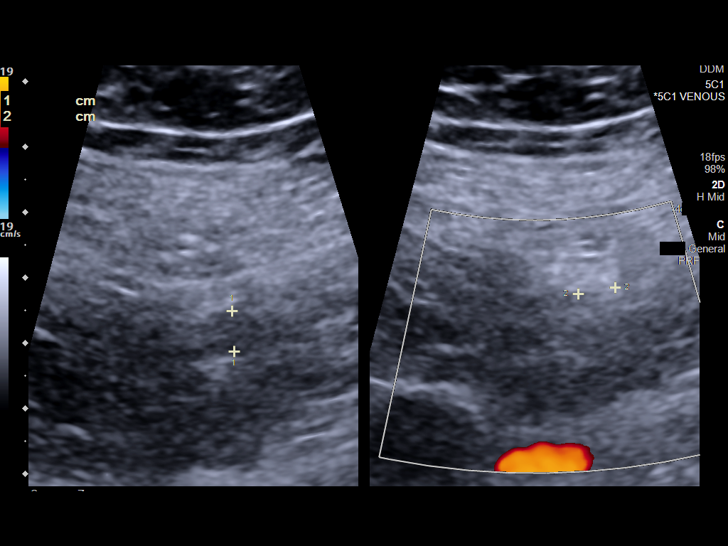
[im 22/34]
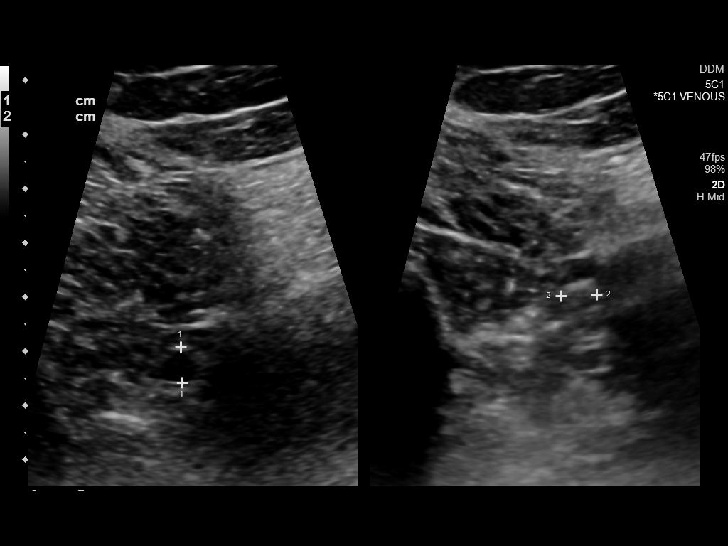
[im 25/34]
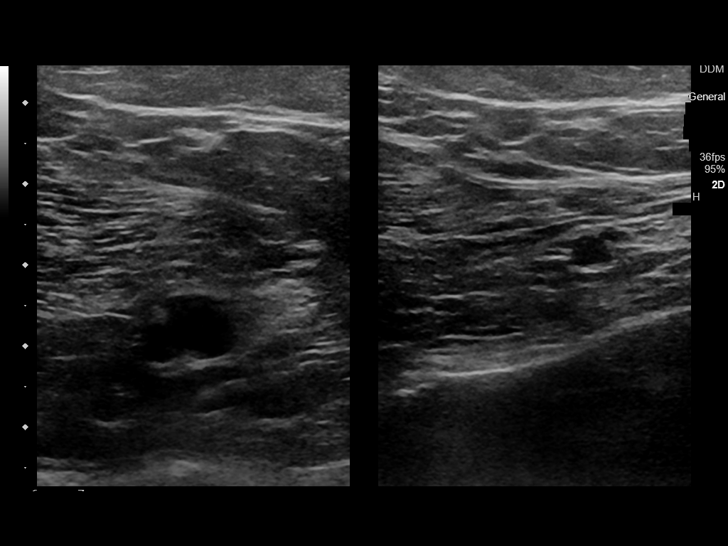
[im 28/34]
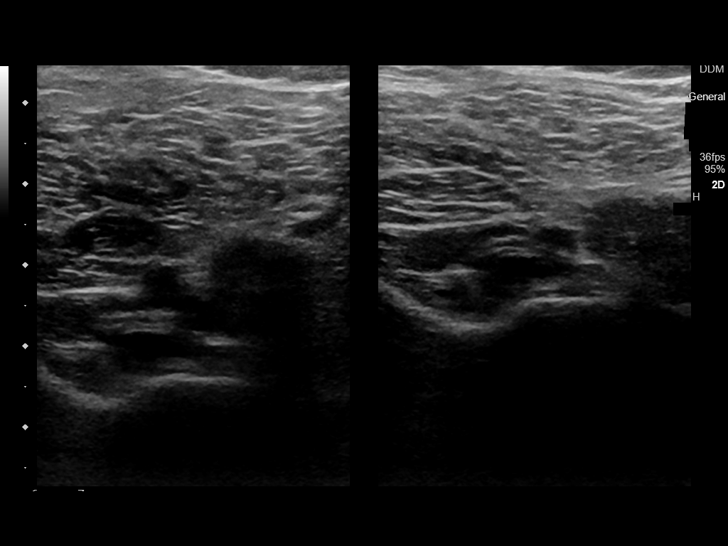
[im 31/34]
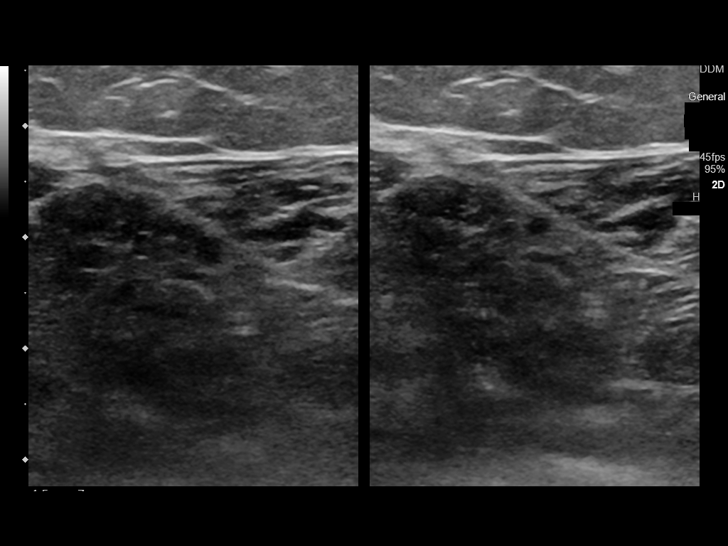
[im 34/34]
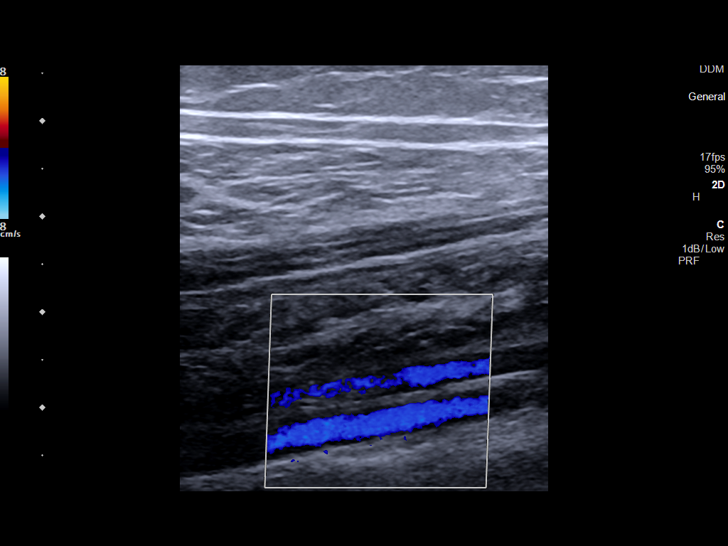

[13 of 24 positions shown; findings below may reference images not displayed]

FINDINGS: Contralateral Common Femoral Vein: Respiratory phasicity is normal
and symmetric with the symptomatic side. No evidence of thrombus.
Normal compressibility.

Common Femoral Vein: No evidence of thrombus. Normal
compressibility, respiratory phasicity and response to augmentation.

Saphenofemoral Junction: No evidence of thrombus. Normal
compressibility and flow on color Doppler imaging.

Profunda Femoral Vein: No evidence of thrombus. Normal
compressibility and flow on color Doppler imaging.

Femoral Vein: No evidence of thrombus. Normal compressibility,
respiratory phasicity and response to augmentation.

Popliteal Vein: No evidence of thrombus. Normal compressibility,
respiratory phasicity and response to augmentation.

Calf Veins: No evidence of thrombus. Normal compressibility and flow
on color Doppler imaging.

Superficial Great Saphenous Vein: No evidence of thrombus. Normal
compressibility.

Other Findings:  None.
IMPRESSION: No evidence of deep venous thrombosis.

## 2022-01-26 ENCOUNTER — Ambulatory Visit: Payer: Self-pay | Admitting: Family Medicine

## 2022-03-04 ENCOUNTER — Other Ambulatory Visit: Payer: Self-pay

## 2022-03-04 ENCOUNTER — Ambulatory Visit
Admission: RE | Admit: 2022-03-04 | Discharge: 2022-03-04 | Disposition: A | Discharge status: Home / Self Care | Payer: Medicaid Other | Source: Ambulatory Visit | Attending: Physician Assistant | Admitting: Physician Assistant

## 2022-03-04 VITALS — BP 117/58 | HR 69 | Temp 98.5°F | Resp 16

## 2022-03-04 DIAGNOSIS — N898 Other specified noninflammatory disorders of vagina: Secondary | ICD-10-CM | POA: Diagnosis not present

## 2022-03-04 DIAGNOSIS — N760 Acute vaginitis: Secondary | ICD-10-CM

## 2022-03-04 LAB — WET PREP, GENITAL
Sperm: NONE SEEN
Trich, Wet Prep: NONE SEEN
WBC, Wet Prep HPF POC: NONE SEEN — AB (ref <10)

## 2022-03-04 MED ORDER — FLUCONAZOLE 150 MG PO TABS: ORAL_TABLET | ORAL | 0 refills | Status: DC

## 2022-03-04 MED ORDER — METRONIDAZOLE 500 MG PO TABS: 500.0000 mg | ORAL_TABLET | Freq: Two times a day (BID) | ORAL | 0 refills | Status: AC

## 2022-03-04 NOTE — ED Provider Notes (Signed)
[facility]    CSN: [id number] Arrival date & time: 03/04/22  0949      History   Chief Complaint Chief Complaint  Patient presents with   Vaginal Discharge    Experiencing discharge sometimes it's thick/white and sometimes it's green... experiencing for the past week. - Entered by patient   Appointment    1000    HPI Sonya Sparks is a 26 y.o. female presenting for 1 week history of vaginal discharge.  She says she has not noticed if there is an odor or not.  It is yellowish in coloration.  Reports a history of frequent BV and yeast infections.  States she had a BV infection couple months ago and did not take all the medicine.  She denies any associated urinary symptoms.  No abdominal or pelvic pain or fevers.  No significant concerns for STIs but would like to be tested.  No other complaints.  HPI  History reviewed. No pertinent past medical history.  There are no problems to display for this patient.   History reviewed. No pertinent surgical history.  OB History   No obstetric history on file.      Home Medications    Prior to Admission medications   Medication Sig Start Date End Date Taking? Authorizing Provider  fluconazole (DIFLUCAN) 150 MG tablet Take 1 pill p.o. every 72 hours as needed yeast infection 03/04/22  Yes Eusebio FriendlyEaves, Luan Maberry B, PA-C  metroNIDAZOLE (FLAGYL) 500 MG tablet Take 1 tablet (500 mg total) by mouth 2 (two) times daily for 7 days. 03/04/22 03/11/22 Yes Shirlee LatchEaves, Rola Lennon B, PA-C  albuterol (VENTOLIN HFA) 108 (90 Base) MCG/ACT inhaler Inhale 1-2 puffs into the lungs every 6 (six) hours as needed for wheezing or shortness of breath.    [provider]  etonogestrel (NEXPLANON) 68 MG IMPL implant 1 each by Subdermal route once.    [provider]  naproxen (NAPROSYN) 500 MG tablet Take 1 tablet (500 mg total) by mouth 2 (two) times daily with a meal. 06/01/19   Tommi RumpsSummers, Rhonda L, PA-C    Family History History reviewed. No  pertinent family history.  Social History Social History   Tobacco Use   Smoking status: Every Day    Types: Cigarettes   Smokeless tobacco: Never  Vaping Use   Vaping Use: Never used  Substance Use Topics   Alcohol use: Yes   Drug use: Never     Allergies   Patient has no known allergies.   Review of Systems Review of Systems  Constitutional:  Negative for fatigue and fever.  HENT:  Negative for mouth sores and sore throat.   Gastrointestinal:  Negative for abdominal pain, nausea and vomiting.  Genitourinary:  Positive for vaginal discharge. Negative for dysuria, flank pain, frequency, hematuria, urgency, vaginal bleeding and vaginal pain.  Musculoskeletal:  Negative for back pain.  Skin:  Negative for rash.    Physical Exam Triage Vital Signs ED Triage Vitals  Enc Vitals Group     BP      Pulse      Resp      Temp      Temp src      SpO2      Weight      Height      Head Circumference      Peak Flow      Pain Score      Pain Loc      Pain Edu?  Excl. in GC?    No data found.  Updated Vital Signs BP (!) 117/58 (BP Location: Left Arm)   Pulse 69   Temp 98.5 F (36.9 C)   Resp 16   LMP 02/01/2022 (Approximate)   SpO2 100%       Physical Exam Vitals and nursing note reviewed.  Constitutional:      General: She is not in acute distress.    Appearance: Normal appearance. She is not ill-appearing or toxic-appearing.  HENT:     Head: Normocephalic and atraumatic.  Eyes:     General: No scleral icterus.       Right eye: No discharge.        Left eye: No discharge.     Conjunctiva/sclera: Conjunctivae normal.  Cardiovascular:     Rate and Rhythm: Normal rate.  Pulmonary:     Effort: Pulmonary effort is normal. No respiratory distress.  Abdominal:     Palpations: Abdomen is soft.     Tenderness: There is no abdominal tenderness. There is no right CVA tenderness or left CVA tenderness.  Musculoskeletal:     Cervical back: Neck supple.   Skin:    General: Skin is dry.  Neurological:     General: No focal deficit present.     Mental Status: She is alert. Mental status is at baseline.     Motor: No weakness.     Gait: Gait normal.  Psychiatric:        Mood and Affect: Mood normal.        Behavior: Behavior normal.        Thought Content: Thought content normal.     UC Treatments / Results  Labs (all labs ordered are listed, but only abnormal results are displayed) Labs Reviewed  WET PREP, GENITAL - Abnormal; Notable for the following components:      Result Value   Yeast Wet Prep HPF POC PRESENT (*)    Clue Cells Wet Prep HPF POC PRESENT (*)    WBC, Wet Prep HPF POC NONE SEEN (*)    All other components within normal limits  CERVICOVAGINAL ANCILLARY ONLY    EKG   Radiology No results found.  Procedures Procedures (including critical care time)  Medications Ordered in UC Medications - No data to display  Initial Impression / Assessment and Plan / UC Course  I have reviewed the triage vital signs and the nursing notes.  Pertinent labs & imaging results that were available during my care of the patient were reviewed by me and considered in my medical decision making (see chart for details).  26 year old female presenting for abnormal vaginal discharge for the past 1 week.  Vitals are stable.  Patient elects to perform vaginal self swab and forego the pelvic exam.  Also obtained Aptima swab for testing of gonorrhea and chlamydia.  Wet prep shows positive yeast and clue cells.  Will treat for BV and yeast infection.  Sent metronidazole and Diflucan and advised of the importance of taking all the medication.  Reviewed how to access results to the other test and explained that we will call if positive and advised treatment from there.  Follow up as needed.   Final Clinical Impressions(s) / UC Diagnoses   Final diagnoses:  Vaginal discharge  Acute vaginitis     Discharge Instructions      -You have  BV and yeast infection.  I sent antibiotics and antifungal medicines to pharmacy. - Your STI results will be back in  the next 24 to 48 hours.  Results to be on MyChart.  We will call you with any positive test results and advise treatment from there. - Always practice safe sex.     ED Prescriptions     Medication Sig Dispense Auth. Provider   metroNIDAZOLE (FLAGYL) 500 MG tablet Take 1 tablet (500 mg total) by mouth 2 (two) times daily for 7 days. 14 tablet Eusebio Friendly B, PA-C   fluconazole (DIFLUCAN) 150 MG tablet Take 1 pill p.o. every 72 hours as needed yeast infection 2 tablet Shirlee Latch, PA-C      PDMP not reviewed this encounter.   Shirlee Latch, PA-C 03/04/22 1117

## 2022-03-04 NOTE — ED Triage Notes (Signed)
Patient presents to Urgent Care with complaints of vaginal discharge x 1 week.  Has a hx of BV/yeast. She states she has noticed she has them frequently since getting off BC.   Denies fever, abdominal pain, or urinary symptoms.

## 2022-03-04 NOTE — Discharge Instructions (Addendum)
-  You have BV and yeast infection.  I sent antibiotics and antifungal medicines to pharmacy. - Your STI results will be back in the next 24 to 48 hours.  Results to be on MyChart.  We will call you with any positive test results and advise treatment from there. - Always practice safe sex.

## 2022-03-05 LAB — CERVICOVAGINAL ANCILLARY ONLY
Chlamydia: NEGATIVE
Comment: NEGATIVE
Comment: NORMAL
Neisseria Gonorrhea: NEGATIVE

## 2022-03-29 ENCOUNTER — Ambulatory Visit (INDEPENDENT_AMBULATORY_CARE_PROVIDER_SITE_OTHER): Payer: Self-pay | Admitting: Podiatry

## 2022-03-29 DIAGNOSIS — Z91199 Patient's noncompliance with other medical treatment and regimen due to unspecified reason: Secondary | ICD-10-CM

## 2022-03-30 NOTE — Progress Notes (Signed)
Patient was no-show for appointment today 

## 2022-07-13 ENCOUNTER — Ambulatory Visit
Admission: RE | Admit: 2022-07-13 | Discharge: 2022-07-13 | Disposition: A | Discharge status: Home / Self Care | Payer: Medicaid Other | Source: Ambulatory Visit | Attending: Physician Assistant | Admitting: Physician Assistant

## 2022-07-13 VITALS — BP 112/51 | HR 66 | Temp 98.6°F | Resp 16

## 2022-07-13 DIAGNOSIS — T3695XA Adverse effect of unspecified systemic antibiotic, initial encounter: Secondary | ICD-10-CM

## 2022-07-13 DIAGNOSIS — B379 Candidiasis, unspecified: Secondary | ICD-10-CM | POA: Diagnosis not present

## 2022-07-13 DIAGNOSIS — B9689 Other specified bacterial agents as the cause of diseases classified elsewhere: Secondary | ICD-10-CM | POA: Diagnosis not present

## 2022-07-13 DIAGNOSIS — N760 Acute vaginitis: Secondary | ICD-10-CM

## 2022-07-13 LAB — WET PREP, GENITAL
Sperm: NONE SEEN
Trich, Wet Prep: NONE SEEN
WBC, Wet Prep HPF POC: <10 — AB (ref <10)
Yeast Wet Prep HPF POC: NONE SEEN

## 2022-07-13 MED ORDER — METRONIDAZOLE 500 MG PO TABS: 500.0000 mg | ORAL_TABLET | Freq: Two times a day (BID) | ORAL | 0 refills | Status: AC

## 2022-07-13 MED ORDER — FLUCONAZOLE 150 MG PO TABS: ORAL_TABLET | ORAL | 0 refills | Status: AC

## 2022-07-13 NOTE — ED Triage Notes (Signed)
Pt c/o abnormal vaginal discharge for over a week.

## 2022-07-13 NOTE — Discharge Instructions (Addendum)
-  You have BV.  I sent antibiotics to pharmacy.  Take 1 tablet every 12 hours for 7 days.  Do not drink alcohol with it. - I also sent Diflucan in case to get a yeast infection.  Take one of the tablets in 2 to 3 days to try to prevent yeast infection and another 1 if you do develop yeast infection. - Use pH balanced feminine washes or plain bar of soap. - Follow-up as needed.

## 2022-07-13 NOTE — ED Provider Notes (Signed)
[facility]    CSN: [id number] Arrival date & time: 07/13/22  1146      History   Chief Complaint Chief Complaint  Patient presents with   Vaginal Discharge    Possible bacteria/ yeast - Entered by patient    HPI Sonya Sparks is a 26 y.o. female presenting for >1 week history of abnormal vaginal discharge. Reports history of BV. No significant concern for STIs. She was tested for STIs about 4 months ago and negative.  Denies fever, fatigue, abdominal/pelvic pain, dysuria, frequency or urgency.  No flank pain.  Patient reports that she gets BV a couple times a year.  No other complaints.  HPI  History reviewed. No pertinent past medical history.  There are no problems to display for this patient.   History reviewed. No pertinent surgical history.  OB History   No obstetric history on file.      Home Medications    Prior to Admission medications   Medication Sig Start Date End Date Taking? Authorizing Provider  fluconazole (DIFLUCAN) 150 MG tablet Take 1 tab p.o. every 72 hours for yeast infection 07/13/22  Yes Eusebio Friendly B, PA-C  metroNIDAZOLE (FLAGYL) 500 MG tablet Take 1 tablet (500 mg total) by mouth 2 (two) times daily for 7 days. 07/13/22 07/20/22 Yes Shirlee Latch, PA-C  albuterol (VENTOLIN HFA) 108 (90 Base) MCG/ACT inhaler Inhale 1-2 puffs into the lungs every 6 (six) hours as needed for wheezing or shortness of breath.    [provider]  etonogestrel (NEXPLANON) 68 MG IMPL implant 1 each by Subdermal route once.    [provider]    Family History No family history on file.  Social History Social History   Tobacco Use   Smoking status: Every Day    Types: Cigarettes   Smokeless tobacco: Never  Vaping Use   Vaping Use: Never used  Substance Use Topics   Alcohol use: Yes   Drug use: Never     Allergies   Patient has no known allergies.   Review of Systems Review of Systems  Constitutional:  Negative for  fatigue and fever.  HENT:  Negative for mouth sores.   Gastrointestinal:  Negative for abdominal pain, nausea and vomiting.  Genitourinary:  Positive for vaginal discharge. Negative for dysuria, flank pain, frequency, hematuria, urgency, vaginal bleeding and vaginal pain.  Musculoskeletal:  Negative for back pain.  Skin:  Negative for rash.     Physical Exam Triage Vital Signs ED Triage Vitals  Enc Vitals Group     BP 07/13/22 1218 (!) 112/51     Pulse Rate 07/13/22 1218 66     Resp 07/13/22 1218 16     Temp 07/13/22 1218 98.6 F (37 C)     Temp Source 07/13/22 1218 Oral     SpO2 07/13/22 1218 99 %     Weight --      Height --      Head Circumference --      Peak Flow --      Pain Score 07/13/22 1215 0     Pain Loc --      Pain Edu? --      Excl. in GC? --    No data found.  Updated Vital Signs BP (!) 112/51 (BP Location: Left Arm)   Pulse 66   Temp 98.6 F (37 C) (Oral)   Resp 16   LMP 06/18/2022 (Approximate)   SpO2 99%  Physical Exam Vitals and nursing note reviewed.  Constitutional:      General: She is not in acute distress.    Appearance: Normal appearance. She is not ill-appearing or toxic-appearing.  HENT:     Head: Normocephalic and atraumatic.  Eyes:     General: No scleral icterus.       Right eye: No discharge.        Left eye: No discharge.     Conjunctiva/sclera: Conjunctivae normal.  Cardiovascular:     Rate and Rhythm: Normal rate and regular rhythm.     Heart sounds: Normal heart sounds.  Pulmonary:     Effort: Pulmonary effort is normal. No respiratory distress.     Breath sounds: Normal breath sounds.  Abdominal:     Palpations: Abdomen is soft.     Tenderness: There is no abdominal tenderness.  Musculoskeletal:     Cervical back: Neck supple.  Skin:    General: Skin is dry.  Neurological:     General: No focal deficit present.     Mental Status: She is alert. Mental status is at baseline.     Motor: No weakness.     Gait:  Gait normal.  Psychiatric:        Mood and Affect: Mood normal.        Behavior: Behavior normal.        Thought Content: Thought content normal.      UC Treatments / Results  Labs (all labs ordered are listed, but only abnormal results are displayed) Labs Reviewed  WET PREP, GENITAL - Abnormal; Notable for the following components:      Result Value   Clue Cells Wet Prep HPF POC PRESENT (*)    WBC, Wet Prep HPF POC <10 (*)    All other components within normal limits    EKG   Radiology No results found.  Procedures Procedures (including critical care time)  Medications Ordered in UC Medications - No data to display  Initial Impression / Assessment and Plan / UC Course  I have reviewed the triage vital signs and the nursing notes.  Pertinent labs & imaging results that were available during my care of the patient were reviewed by me and considered in my medical decision making (see chart for details).   26 year old female presenting for abnormal vaginal discharge for the past 1 week.  Vitals are stable.  Patient elects to perform vaginal self swab and forego the pelvic exam.   Wet prep shows positive results.  Will treat for BV and since patient has a history of antibiotic-induced yeast infections I also sent Diflucan.  Sent metronidazole and advised of the importance of taking all the medication.  Reviewed how to access results to testing. Follow up as needed.   Final Clinical Impressions(s) / UC Diagnoses   Final diagnoses:  Bacterial vaginosis  Antibiotic-induced yeast infection     Discharge Instructions      -You have BV.  I sent antibiotics to pharmacy.  Take 1 tablet every 12 hours for 7 days.  Do not drink alcohol with it. - I also sent Diflucan in case to get a yeast infection.  Take one of the tablets in 2 to 3 days to try to prevent yeast infection and another 1 if you do develop yeast infection. - Use pH balanced feminine washes or plain bar of  soap. - Follow-up as needed.     ED Prescriptions     Medication Sig Dispense Auth. Provider  metroNIDAZOLE (FLAGYL) 500 MG tablet Take 1 tablet (500 mg total) by mouth 2 (two) times daily for 7 days. 14 tablet Laurene Footman B, PA-C   fluconazole (DIFLUCAN) 150 MG tablet Take 1 tab p.o. every 72 hours for yeast infection 2 tablet Danton Clap, PA-C      PDMP not reviewed this encounter.   Danton Clap, PA-C 07/13/22 1301

## 2022-10-28 ENCOUNTER — Ambulatory Visit: Payer: Medicaid Other | Admitting: Family Medicine

## 2024-06-13 ENCOUNTER — Encounter: Payer: Self-pay | Admitting: Physician Assistant
